# Patient Record
Sex: Female | Born: 1985 | Race: White | Hispanic: No | Marital: Married | Smoking: Never smoker
Health system: Southern US, Community
[De-identification: ages and names within clinical notes are randomized; demographics above are authoritative.]

---

## 2011-02-26 ENCOUNTER — Ambulatory Visit: Payer: Self-pay | Admitting: Internal Medicine

## 2011-08-27 ENCOUNTER — Ambulatory Visit: Payer: Self-pay

## 2011-08-28 ENCOUNTER — Ambulatory Visit: Payer: Self-pay | Admitting: Otolaryngology

## 2011-08-28 ENCOUNTER — Other Ambulatory Visit: Payer: Self-pay | Admitting: Otolaryngology

## 2011-08-28 LAB — HCG, QUANTITATIVE, PREGNANCY: Beta Hcg, Quant.: <1 m[IU]/mL — ABNORMAL LOW

## 2011-11-13 ENCOUNTER — Ambulatory Visit: Payer: Self-pay | Admitting: Obstetrics and Gynecology

## 2011-11-13 LAB — URINALYSIS, COMPLETE
Bilirubin,UR: NEGATIVE
Ketone: NEGATIVE
Leukocyte Esterase: NEGATIVE
Ph: 5 (ref 4.5–8.0)
Protein: NEGATIVE
RBC,UR: 1 /HPF (ref 0–5)
Specific Gravity: 1.019 (ref 1.003–1.030)
Squamous Epithelial: 2
WBC UR: 1 /HPF (ref 0–5)

## 2011-11-19 ENCOUNTER — Ambulatory Visit: Payer: Self-pay | Admitting: Obstetrics and Gynecology

## 2012-07-17 ENCOUNTER — Encounter: Payer: Self-pay | Admitting: Obstetrics and Gynecology

## 2013-01-29 ENCOUNTER — Inpatient Hospital Stay: Payer: Self-pay

## 2013-01-29 LAB — CBC WITH DIFFERENTIAL/PLATELET
BASOS ABS: 0 10*3/uL (ref 0.0–0.1)
Basophil %: 0.3 %
EOS ABS: 0.3 10*3/uL (ref 0.0–0.7)
EOS PCT: 2.9 %
HCT: 38.6 % (ref 35.0–47.0)
HGB: 13.4 g/dL (ref 12.0–16.0)
LYMPHS ABS: 1.4 10*3/uL (ref 1.0–3.6)
Lymphocyte %: 14.8 %
MCH: 32.9 pg (ref 26.0–34.0)
MCHC: 34.8 g/dL (ref 32.0–36.0)
MCV: 94 fL (ref 80–100)
Monocyte #: 0.9 x10 3/mm (ref 0.2–0.9)
Monocyte %: 8.8 %
Neutrophil #: 7.1 10*3/uL — ABNORMAL HIGH (ref 1.4–6.5)
Neutrophil %: 73.2 %
Platelet: 169 10*3/uL (ref 150–440)
RBC: 4.09 10*6/uL (ref 3.80–5.20)
RDW: 13 % (ref 11.5–14.5)
WBC: 9.8 10*3/uL (ref 3.6–11.0)

## 2013-01-31 LAB — HEMOGLOBIN: HGB: 11.6 g/dL — AB (ref 12.0–16.0)

## 2013-02-07 ENCOUNTER — Ambulatory Visit: Payer: Self-pay | Admitting: Pediatrics

## 2014-02-16 IMAGING — CT CT MAXILLOFACIAL WITHOUT CONTRAST
1 series · 16 of 30 positions shown, 20 images · non-contrast
Comparison: none

REASON FOR EXAM: CR [id number] chronic sinusitis
COMMENTS:

PROCEDURE:     KCT - KCT MAXILLOFACIAL AREA WO  - August 28, 2011 [DATE]
RESULT:     History: Sinusitis.

[Series 2: osteo (id) · axial · 0.29mm/px · z∈[-120,+28]mm · 16 of 107 slices shown, 20 images]
[im 4/107  brain]
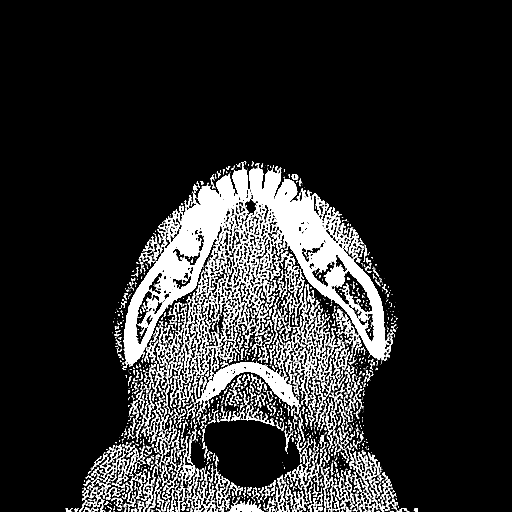
[im 4/107  bone]
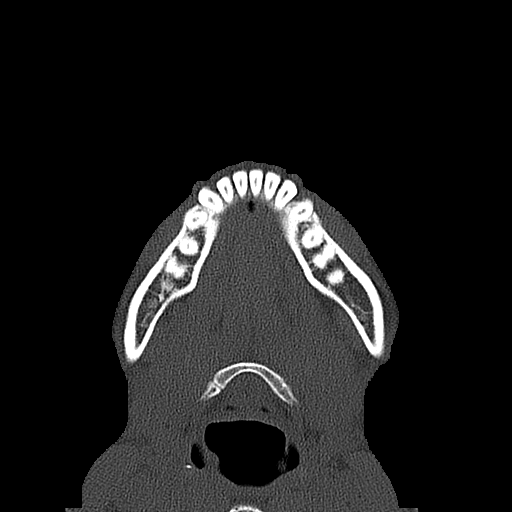
[im 11/107  bone]
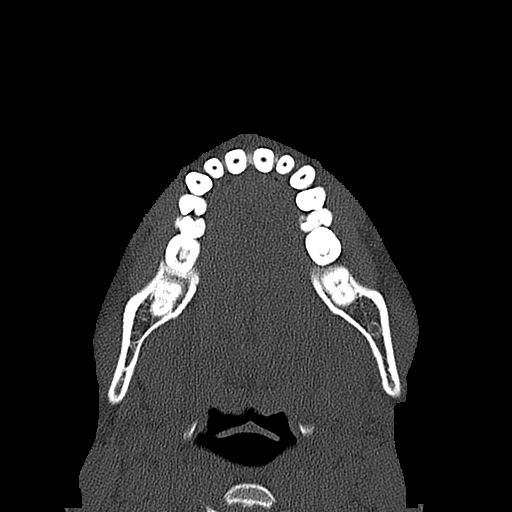
[im 19/107  bone]
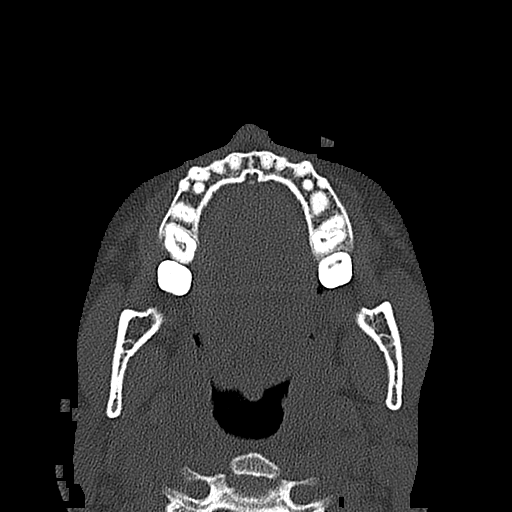
[im 26/107  bone]
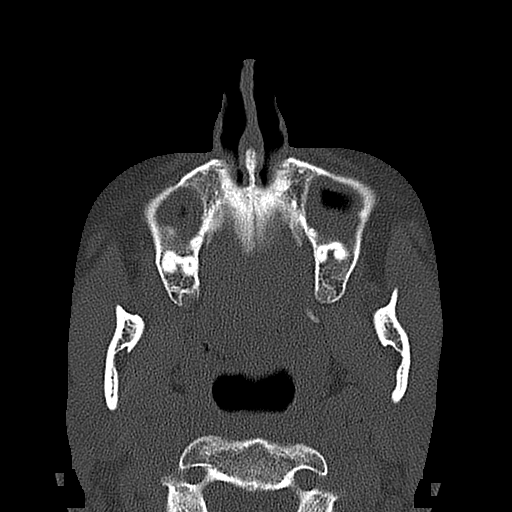
[im 30/107  brain]
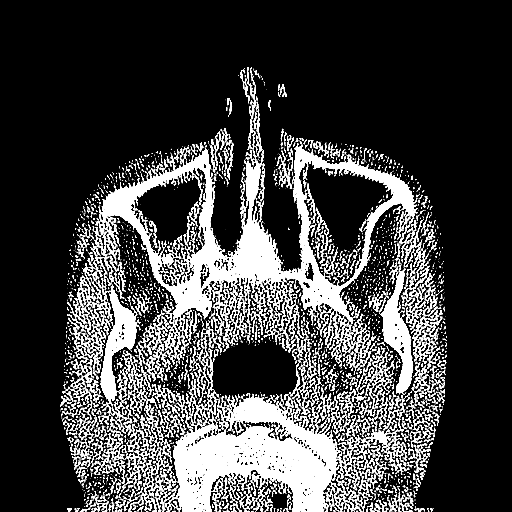
[im 30/107  bone]
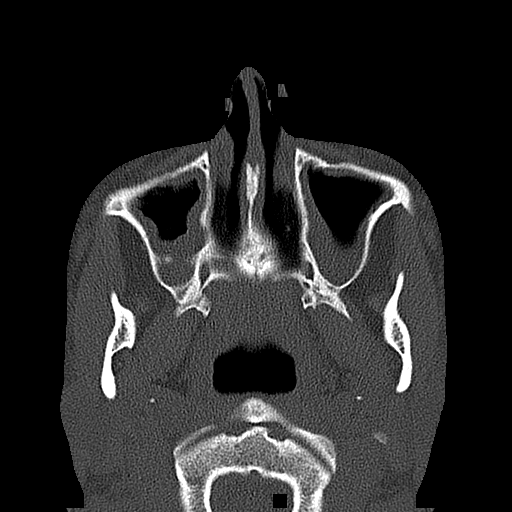
[im 37/107  bone]
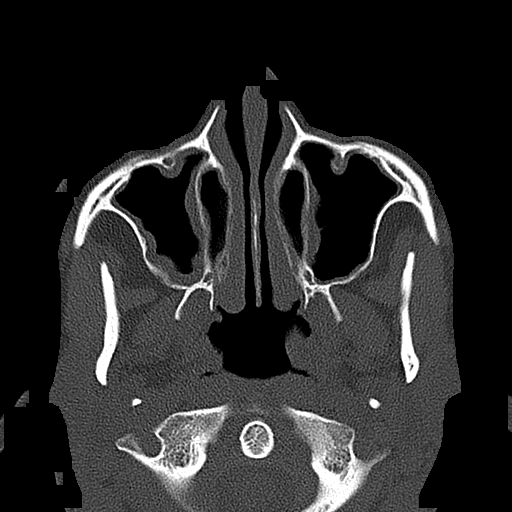
[im 44/107  bone]
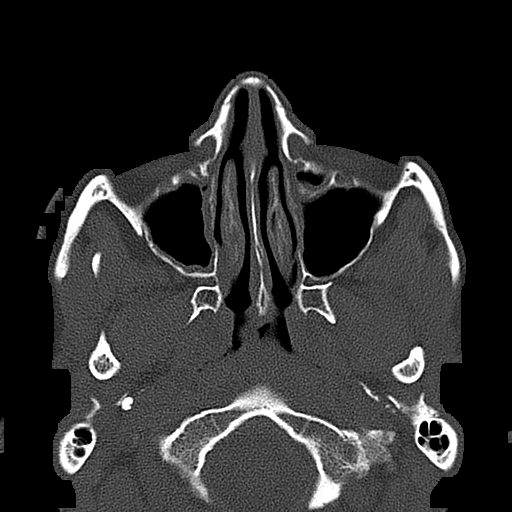
[im 52/107  bone]
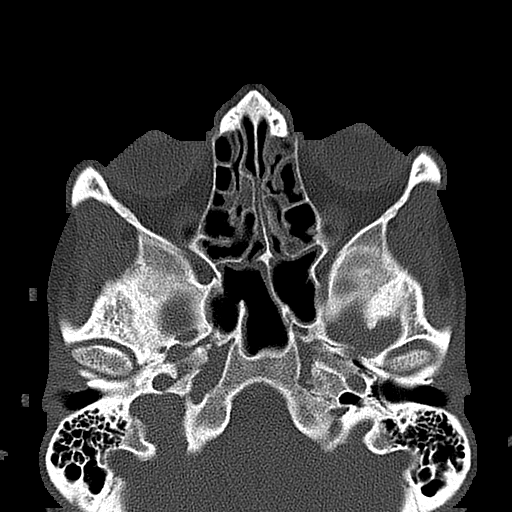
[im 55/107  brain]
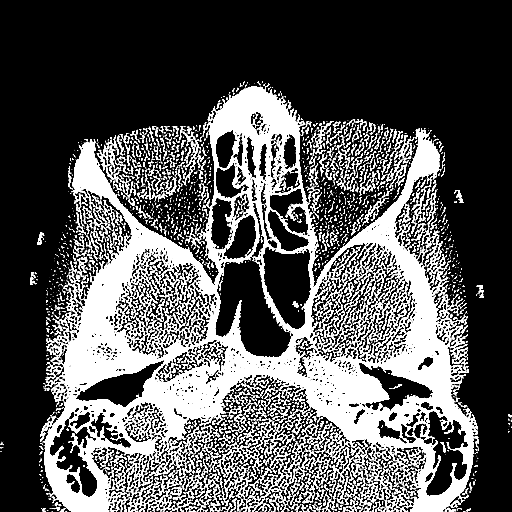
[im 55/107  bone]
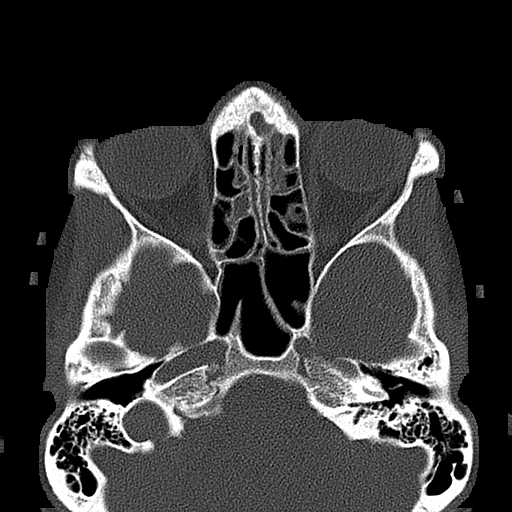
[im 63/107  bone]
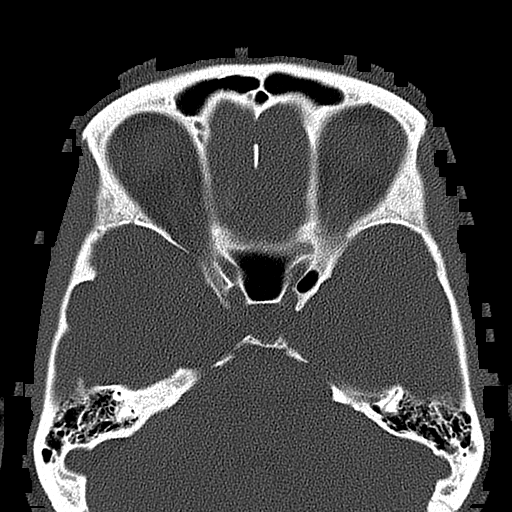
[im 70/107  bone]
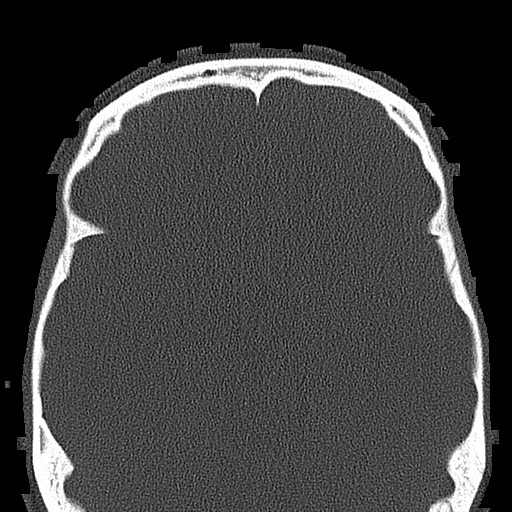
[im 77/107  bone]
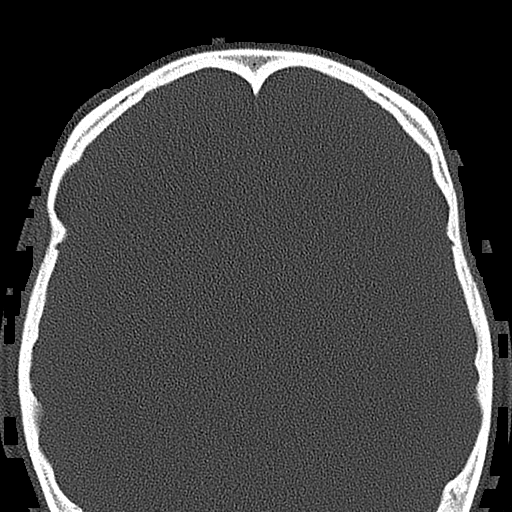
[im 81/107  brain]
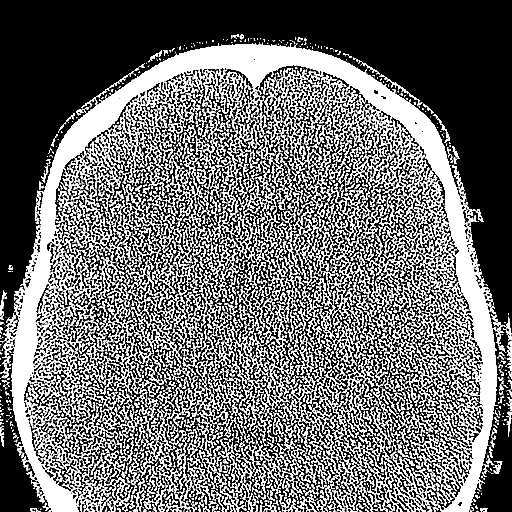
[im 81/107  bone]
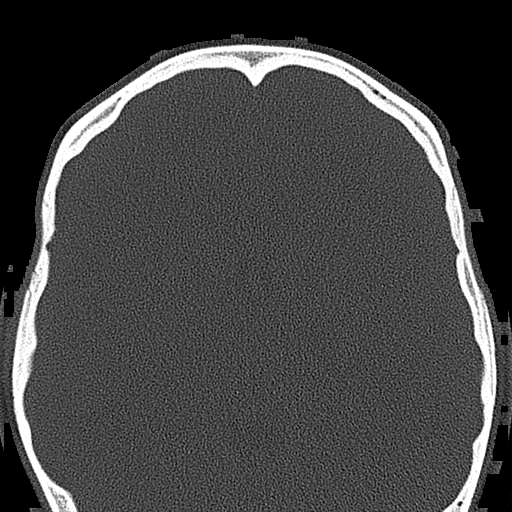
[im 88/107  bone]
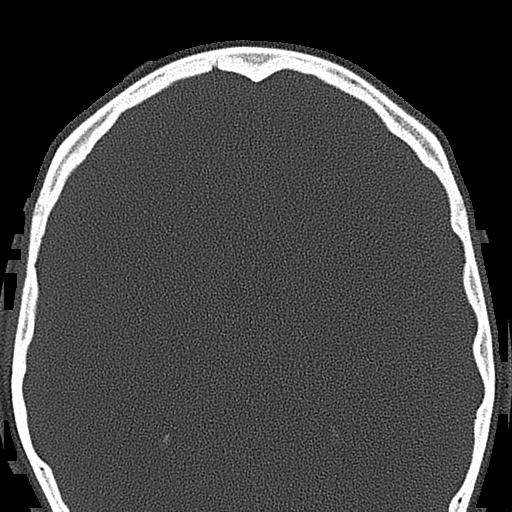
[im 96/107  bone]
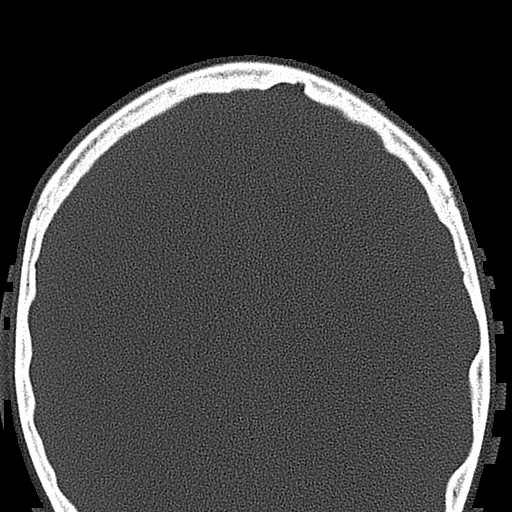
[im 103/107  bone]
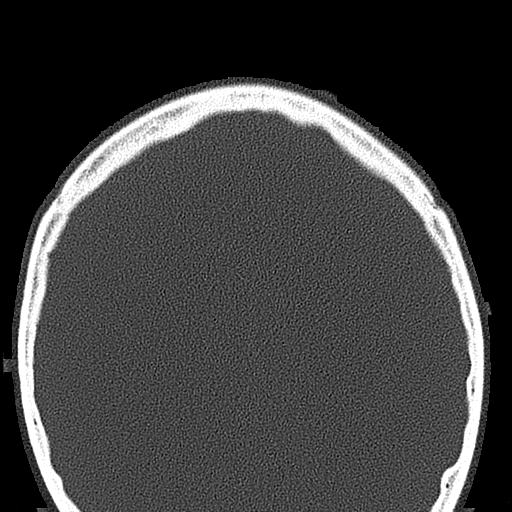

[16 of 30 positions shown; findings below may reference images not displayed]

FINDINGS: Standard CT of the paranasal sinuses obtained. Mild mucosal
thickening of the frontal, ethmoid sinuses , and maxillary sinuses
bilaterally. Similar findings noted in the sphenoid sinus. Mucosal
thickening of the maxillary ostia. Mastoids are clear. Nasopharynx clear
.Orbits are unremarkable. No acute bony abnormality identified.
IMPRESSION: Mild changes of chronic pansinusitis.

## 2014-03-04 ENCOUNTER — Ambulatory Visit: Payer: Self-pay | Admitting: Psychiatry

## 2014-03-04 LAB — CBC WITH DIFFERENTIAL/PLATELET
Basophil #: 0 10*3/uL (ref 0.0–0.1)
Basophil %: 0.6 %
Eosinophil #: 0.1 10*3/uL (ref 0.0–0.7)
Eosinophil %: 1.6 %
HCT: 40.8 % (ref 35.0–47.0)
HGB: 12.9 g/dL (ref 12.0–16.0)
Lymphocyte #: 1.2 10*3/uL (ref 1.0–3.6)
Lymphocyte %: 26.8 %
MCH: 28.7 pg (ref 26.0–34.0)
MCHC: 31.7 g/dL — ABNORMAL LOW (ref 32.0–36.0)
MCV: 91 fL (ref 80–100)
Monocyte #: 0.3 x10 3/mm (ref 0.2–0.9)
Monocyte %: 6 %
NEUTROS PCT: 65 %
Neutrophil #: 2.9 10*3/uL (ref 1.4–6.5)
PLATELETS: 182 10*3/uL (ref 150–440)
RBC: 4.5 10*6/uL (ref 3.80–5.20)
RDW: 13.7 % (ref 11.5–14.5)
WBC: 4.4 10*3/uL (ref 3.6–11.0)

## 2014-03-04 LAB — COMPREHENSIVE METABOLIC PANEL
AST: 23 U/L (ref 15–37)
Albumin: 3.6 g/dL (ref 3.4–5.0)
Alkaline Phosphatase: 63 U/L (ref 46–116)
Anion Gap: 5 — ABNORMAL LOW (ref 7–16)
BUN: 10 mg/dL (ref 7–18)
Bilirubin,Total: 0.5 mg/dL (ref 0.2–1.0)
CHLORIDE: 106 mmol/L (ref 98–107)
CREATININE: 0.71 mg/dL (ref 0.60–1.30)
Calcium, Total: 8.4 mg/dL — ABNORMAL LOW (ref 8.5–10.1)
Co2: 31 mmol/L (ref 21–32)
EGFR (African American): >60
EGFR (Non-African Amer.): >60
Glucose: 65 mg/dL (ref 65–99)
Osmolality: 280 (ref 275–301)
POTASSIUM: 3.7 mmol/L (ref 3.5–5.1)
SGPT (ALT): 18 U/L (ref 14–63)
SODIUM: 142 mmol/L (ref 136–145)
TOTAL PROTEIN: 7 g/dL (ref 6.4–8.2)

## 2014-03-04 LAB — FOLATE: Folic Acid: 15.2 ng/mL (ref 3.1–17.5)

## 2014-03-04 LAB — TSH: Thyroid Stimulating Horm: 0.815 u[IU]/mL

## 2014-05-18 NOTE — Op Note (Signed)
PATIENT NAME:  Kristen Arias, Kristen Arias MR#:  [id number] DATE OF BIRTH:  Jan 12, 1986  DATE OF PROCEDURE:  11/19/2011  PREOPERATIVE DIAGNOSIS: Pelvic pain.   POSTOPERATIVE DIAGNOSES: 1. Pelvic pain. 2. Paratubal cyst. 3. Minimal adhesive disease.   SURGEON: Ricky L. Logan BoresEvans, MD  ASSISTANT: Jennell Cornerhomas Schermerhorn, M.D.   ANESTHESIA: General endotracheal.   PROCEDURES:  1. Diagnostic laparoscopy.  2. Excision of right paratubal cyst.  3. Release of colonic adhesions from left pelvic brim and thickened uterine right uterosacral.   SPECIMENS: Right paratubal cyst.   DRAINS: In and out catheter with red rubber at the beginning of the case, clear urine.   COMPLICATIONS: None.   PROCEDURE IN DETAIL: Patient was consented. Preop antibiotics given. Taken to the Operating Room where she was placed in supine position, prepped and draped in usual sterile fashion, placed in dorsal lithotomy position. Cervix was visualized, grasped with a single-tooth tenaculum and then Hulka was placed. Bladder was drained.   Next we turned out attention to the abdomen.   Patient had somewhat of a rounded opened umbilicus which required a transverse 1 cm incision through which a 10 mm port was placed. Pneumoperitoneum was established and under direct visualization both 5 mm ports in bilateral lower quadrants with findings as noted above.   With Harmonic scalpel excised the right paratubal cyst. This was sent for permanent specimen. Alternating right and left sides were able to reduce the adherent fat pad of the colon to the left pelvic brim. There were some vascular changes in the posterior cul-de-sac which were cauterized with the Harmonic scalpel. I am uncomfortable labeling as endometriosis. Right uterosacral is somewhat thickened and this was divided with Harmonic scalpel with immediate release of tension. Left pelvis otherwise unremarkable.   Pressure lowered to 6 mmHg. Areas were seen to be hemostatic. Procedure was  felt to achieve maximum efficacy. Ports removed. Pneumoperitoneum was allowed to resolve. Incisions closed with deep of 0, subcutaneous with 3-0 Vicryl. Steri-Strips and Band-Aids were placed.   Patient tolerated procedure well. 24 mL of 0.5% Sensorcaine was used in 2 10cc, one 4 mL aliquots prior to port placements. I anticipate a routine postoperative course.   ____________________________ Reatha Harpsicky L. Logan BoresEvans, MD rle:cms D: 11/19/2011 09:51:51 ET T: 11/19/2011 10:26:59 ET JOB#: [id number]  cc: Ricky L. Logan BoresEvans, MD, <Dictator> Augustina MoodICK L Durwood Dittus MD ELECTRONICALLY SIGNED 11/20/2011 8:38

## 2014-05-22 NOTE — Discharge Summary (Signed)
Dates of Admission and Diagnosis:  Date of Admission 29-Jan-2013   Date of Discharge 01-Feb-2012   Admitting Diagnosis post dates for induction   Final Diagnosis s/p SVD    Chief Complaint/History of Present Illness ibid no complications   Hospital Course:  Hospital Course did well   Condition on Discharge Good   DISCHARGE INSTRUCTIONS HOME MEDS:  Medication Reconciliation: Patient's Home Medications at Discharge:     Physician's Instructions:  Diet Regular   Activity Limitations no lift/sex/strain   Return to Work after follow up visit with MD   Time frame for Follow Up Appointment 4-6 weeks   Electronic Signatures: Margaretha GlassingEvans, Ricky L (MD)  (Signed 04-Jan-15 12:51)  Authored: ADMISSION DATE AND DIAGNOSIS, CHIEF COMPLAINT/HPI, HOSPITAL COURSE, DISCHARGE INSTRUCTIONS HOME MEDS, PATIENT INSTRUCTIONS   Last Updated: 04-Jan-15 12:51 by Margaretha GlassingEvans, Ricky L (MD)

## 2014-06-08 NOTE — H&P (Signed)
L&D Evaluation:  History:  HPI 29 y/o P0 WF 41 weeks   Presents with for post-dates induction   Patient's Medical History No Chronic Illness   Patient's Surgical History none   Medications Pre Natal Vitamins   Allergies NKDA   Social History none   Family History Non-Contributory   ROS:  ROS All systems were reviewed.  HEENT, CNS, GI, GU, Respiratory, CV, Renal and Musculoskeletal systems were found to be normal.   Exam:  General no apparent distress   Abdomen gravid, non-tender   Estimated Fetal Weight Average for gestational age   Reflexes 1+   Clonus negative   Pelvic 3.5/80/hi after cervidil   Mebranes Intact   FHT normal rate with no decels   Lymph no lymphadenopathy   Impression:  Impression post-dates induction, s/p cervidil; hi station, suspicious for CPD   Plan:  Plan monitor contractions and for cervical change   Comments epidural now Pit ;prn If no descent or if fetal stress, will proceed with C/S   Electronic Signatures: Margaretha GlassingEvans, Ricky L (MD)  (Signed 02-Jan-15 08:37)  Authored: L&D Evaluation   Last Updated: 02-Jan-15 08:37 by Margaretha GlassingEvans, Ricky L (MD)

## 2015-01-06 IMAGING — US US OB NUCHAL TRANSLUCENCY 1ST GEST - [facility] NRPT
1 series · 14 of 28 positions shown · non-contrast
Comparison: none

[Series 1: us ob nuchal translucency 1st gest - [facility] nrpt · 0.23mm/px · 14 of 56 slices shown]
[im 3/56]
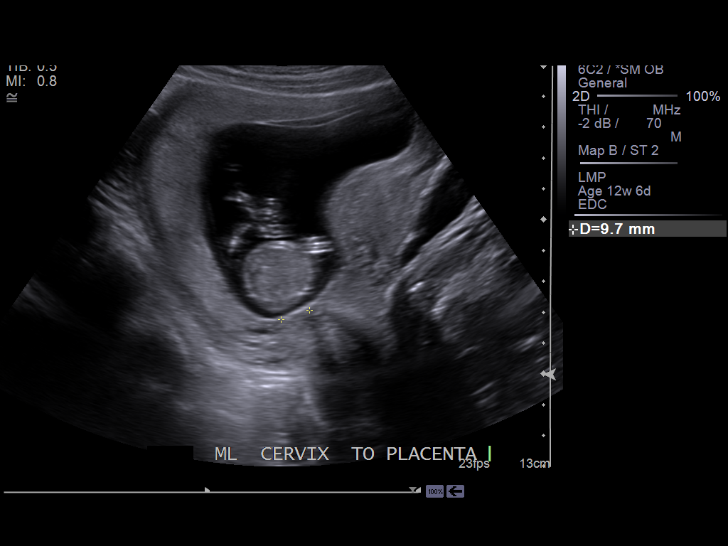
[im 7/56]
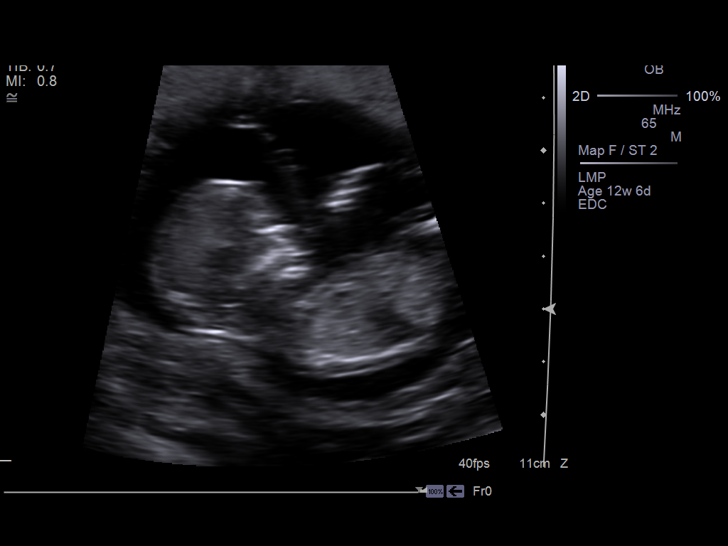
[im 11/56]
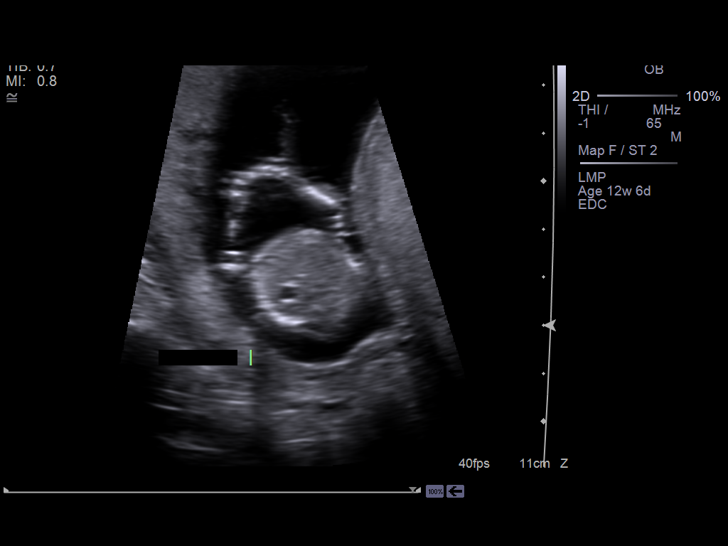
[im 15/56]
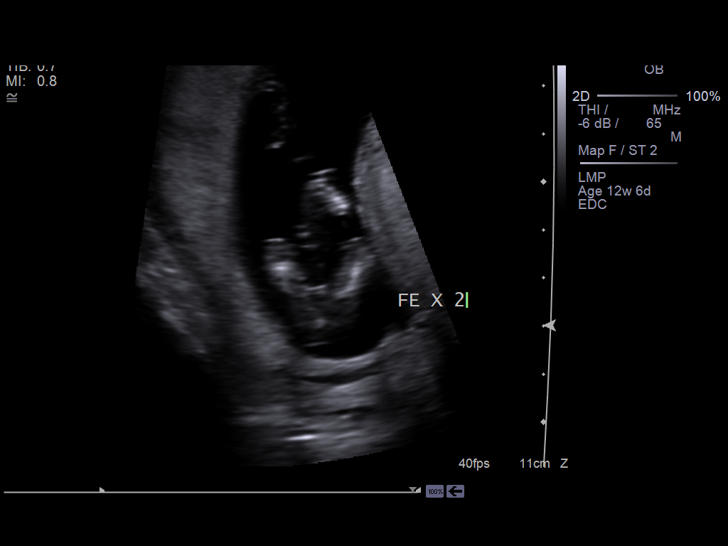
[im 19/56]
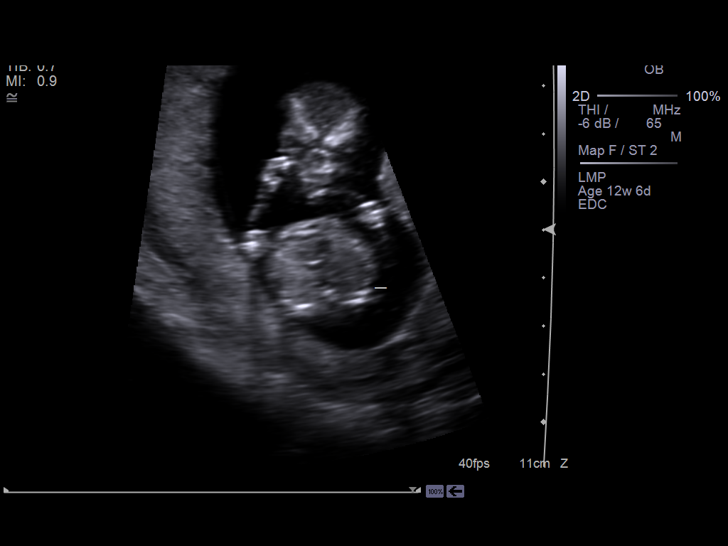
[im 23/56]
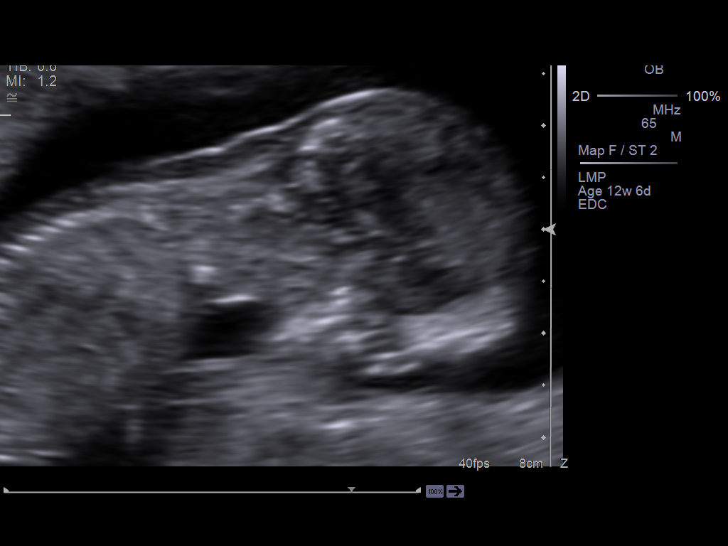
[im 27/56]
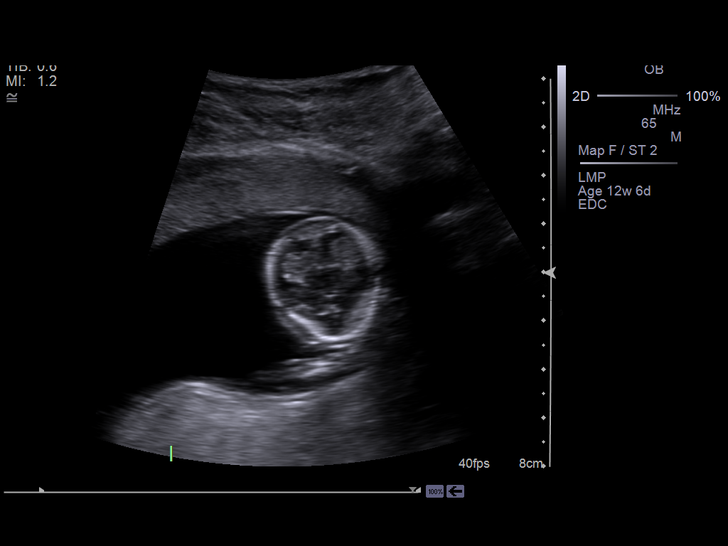
[im 31/56]
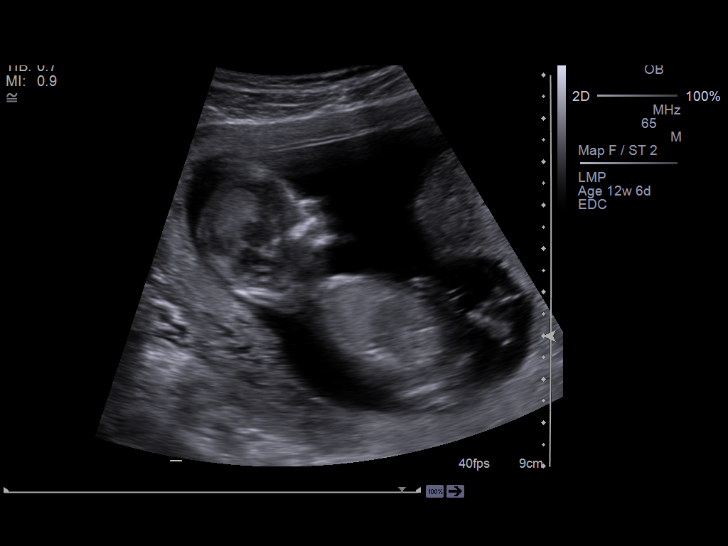
[im 35/56]
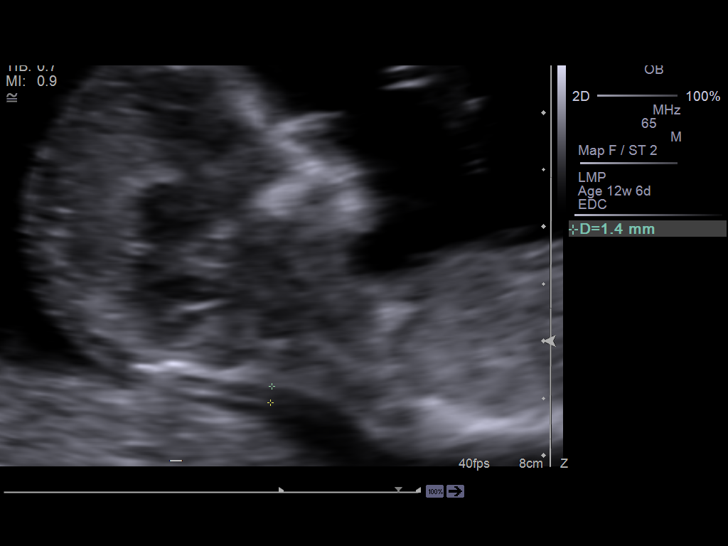
[im 39/56]
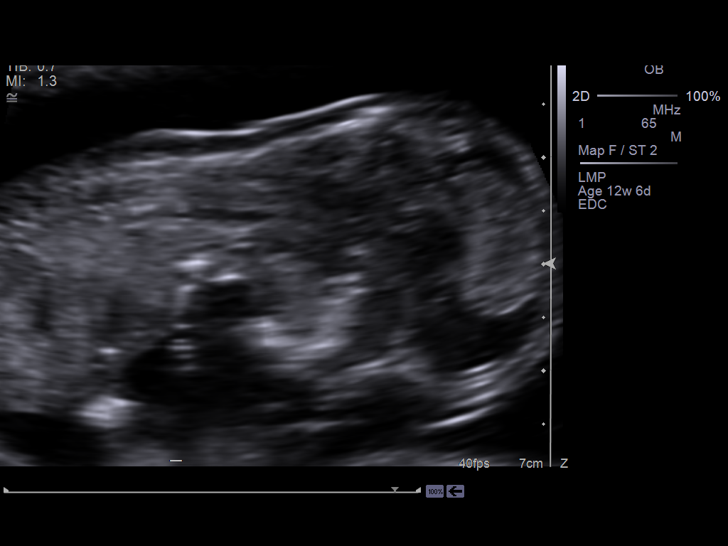
[im 43/56]
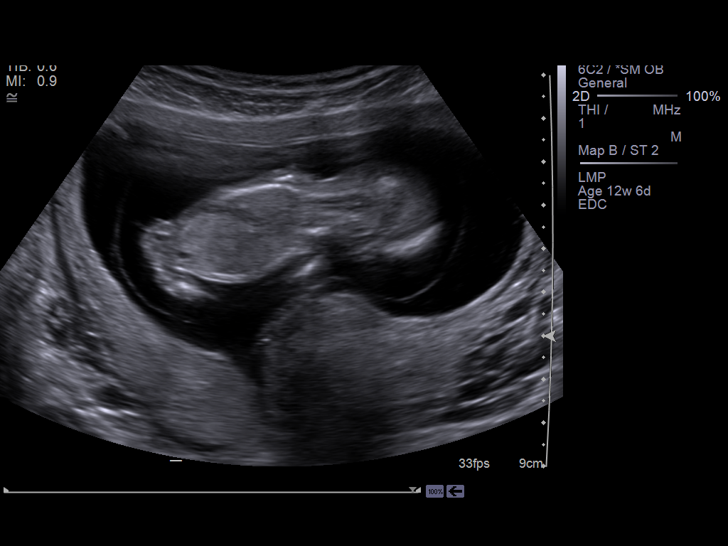
[im 47/56]
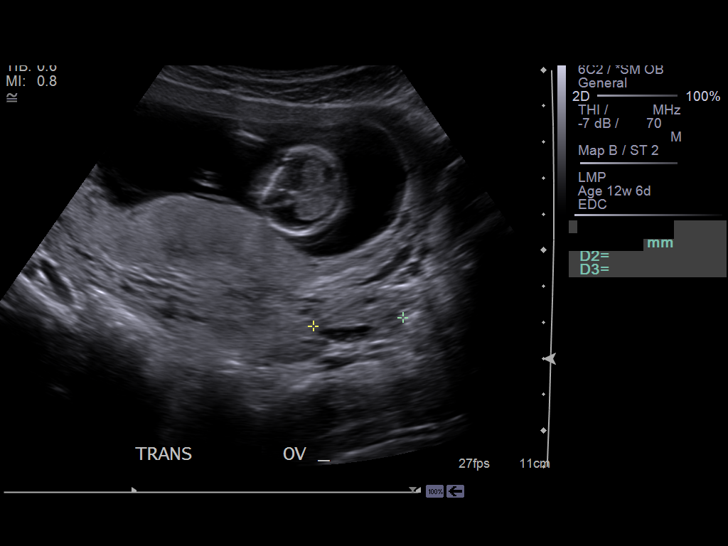
[im 51/56]
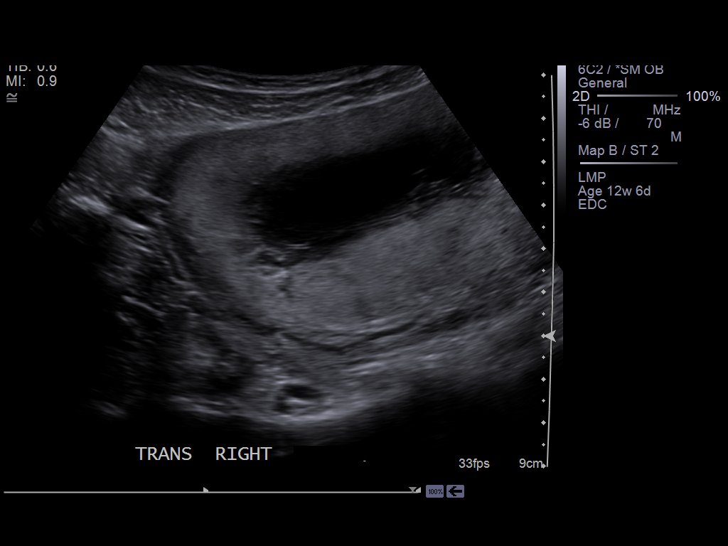
[im 56/56]
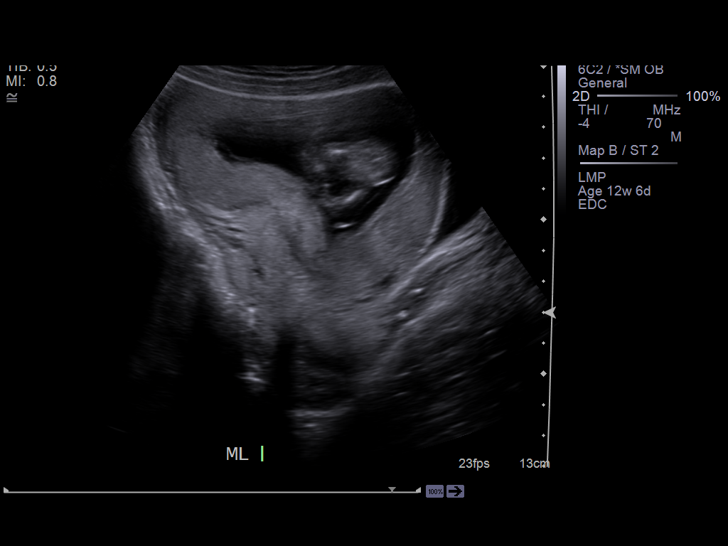

[14 of 28 positions shown; findings below may reference images not displayed]

IMAGES IMPORTED FROM THE [employer] WORKFLOW SYSTEM
NO DICTATION FOR STUDY

## 2015-02-23 DIAGNOSIS — M7981 Nontraumatic hematoma of soft tissue: Secondary | ICD-10-CM | POA: Diagnosis not present

## 2015-03-17 DIAGNOSIS — M7981 Nontraumatic hematoma of soft tissue: Secondary | ICD-10-CM | POA: Diagnosis not present

## 2015-03-17 DIAGNOSIS — I8312 Varicose veins of left lower extremity with inflammation: Secondary | ICD-10-CM | POA: Diagnosis not present

## 2015-03-17 DIAGNOSIS — I83812 Varicose veins of left lower extremities with pain: Secondary | ICD-10-CM | POA: Diagnosis not present

## 2015-03-17 DIAGNOSIS — I87322 Chronic venous hypertension (idiopathic) with inflammation of left lower extremity: Secondary | ICD-10-CM | POA: Diagnosis not present

## 2015-03-31 DIAGNOSIS — I8312 Varicose veins of left lower extremity with inflammation: Secondary | ICD-10-CM | POA: Diagnosis not present

## 2015-03-31 DIAGNOSIS — I87322 Chronic venous hypertension (idiopathic) with inflammation of left lower extremity: Secondary | ICD-10-CM | POA: Diagnosis not present

## 2015-03-31 DIAGNOSIS — I83812 Varicose veins of left lower extremities with pain: Secondary | ICD-10-CM | POA: Diagnosis not present

## 2015-04-13 DIAGNOSIS — B9689 Other specified bacterial agents as the cause of diseases classified elsewhere: Secondary | ICD-10-CM | POA: Diagnosis not present

## 2015-04-13 DIAGNOSIS — N760 Acute vaginitis: Secondary | ICD-10-CM | POA: Diagnosis not present

## 2015-04-13 DIAGNOSIS — F410 Panic disorder [episodic paroxysmal anxiety] without agoraphobia: Secondary | ICD-10-CM | POA: Diagnosis not present

## 2015-04-22 DIAGNOSIS — J301 Allergic rhinitis due to pollen: Secondary | ICD-10-CM | POA: Diagnosis not present

## 2015-04-27 DIAGNOSIS — I8312 Varicose veins of left lower extremity with inflammation: Secondary | ICD-10-CM | POA: Diagnosis not present

## 2015-04-27 DIAGNOSIS — I83812 Varicose veins of left lower extremities with pain: Secondary | ICD-10-CM | POA: Diagnosis not present

## 2015-04-27 DIAGNOSIS — I87322 Chronic venous hypertension (idiopathic) with inflammation of left lower extremity: Secondary | ICD-10-CM | POA: Diagnosis not present

## 2015-05-02 DIAGNOSIS — J301 Allergic rhinitis due to pollen: Secondary | ICD-10-CM | POA: Diagnosis not present

## 2015-05-11 DIAGNOSIS — Z01419 Encounter for gynecological examination (general) (routine) without abnormal findings: Secondary | ICD-10-CM | POA: Diagnosis not present

## 2015-06-30 DIAGNOSIS — M546 Pain in thoracic spine: Secondary | ICD-10-CM | POA: Diagnosis not present

## 2015-06-30 DIAGNOSIS — M545 Low back pain: Secondary | ICD-10-CM | POA: Diagnosis not present

## 2015-06-30 DIAGNOSIS — M542 Cervicalgia: Secondary | ICD-10-CM | POA: Diagnosis not present

## 2015-06-30 DIAGNOSIS — M7631 Iliotibial band syndrome, right leg: Secondary | ICD-10-CM | POA: Diagnosis not present

## 2015-07-04 DIAGNOSIS — M542 Cervicalgia: Secondary | ICD-10-CM | POA: Diagnosis not present

## 2015-07-04 DIAGNOSIS — M545 Low back pain: Secondary | ICD-10-CM | POA: Diagnosis not present

## 2015-07-04 DIAGNOSIS — M546 Pain in thoracic spine: Secondary | ICD-10-CM | POA: Diagnosis not present

## 2015-07-04 DIAGNOSIS — M7631 Iliotibial band syndrome, right leg: Secondary | ICD-10-CM | POA: Diagnosis not present

## 2015-07-06 DIAGNOSIS — M7631 Iliotibial band syndrome, right leg: Secondary | ICD-10-CM | POA: Diagnosis not present

## 2015-07-06 DIAGNOSIS — M546 Pain in thoracic spine: Secondary | ICD-10-CM | POA: Diagnosis not present

## 2015-07-06 DIAGNOSIS — M545 Low back pain: Secondary | ICD-10-CM | POA: Diagnosis not present

## 2015-07-06 DIAGNOSIS — M542 Cervicalgia: Secondary | ICD-10-CM | POA: Diagnosis not present

## 2015-07-11 DIAGNOSIS — M542 Cervicalgia: Secondary | ICD-10-CM | POA: Diagnosis not present

## 2015-07-11 DIAGNOSIS — M7631 Iliotibial band syndrome, right leg: Secondary | ICD-10-CM | POA: Diagnosis not present

## 2015-07-11 DIAGNOSIS — M546 Pain in thoracic spine: Secondary | ICD-10-CM | POA: Diagnosis not present

## 2015-07-11 DIAGNOSIS — M545 Low back pain: Secondary | ICD-10-CM | POA: Diagnosis not present

## 2015-07-14 DIAGNOSIS — M542 Cervicalgia: Secondary | ICD-10-CM | POA: Diagnosis not present

## 2015-07-14 DIAGNOSIS — M545 Low back pain: Secondary | ICD-10-CM | POA: Diagnosis not present

## 2015-07-14 DIAGNOSIS — M546 Pain in thoracic spine: Secondary | ICD-10-CM | POA: Diagnosis not present

## 2015-07-14 DIAGNOSIS — M7631 Iliotibial band syndrome, right leg: Secondary | ICD-10-CM | POA: Diagnosis not present

## 2015-07-25 DIAGNOSIS — M7631 Iliotibial band syndrome, right leg: Secondary | ICD-10-CM | POA: Diagnosis not present

## 2015-07-25 DIAGNOSIS — M545 Low back pain: Secondary | ICD-10-CM | POA: Diagnosis not present

## 2015-07-25 DIAGNOSIS — M546 Pain in thoracic spine: Secondary | ICD-10-CM | POA: Diagnosis not present

## 2015-07-25 DIAGNOSIS — M542 Cervicalgia: Secondary | ICD-10-CM | POA: Diagnosis not present

## 2015-07-28 DIAGNOSIS — M542 Cervicalgia: Secondary | ICD-10-CM | POA: Diagnosis not present

## 2015-07-28 DIAGNOSIS — M545 Low back pain: Secondary | ICD-10-CM | POA: Diagnosis not present

## 2015-07-28 DIAGNOSIS — M7631 Iliotibial band syndrome, right leg: Secondary | ICD-10-CM | POA: Diagnosis not present

## 2015-07-28 DIAGNOSIS — M546 Pain in thoracic spine: Secondary | ICD-10-CM | POA: Diagnosis not present

## 2015-08-01 DIAGNOSIS — M546 Pain in thoracic spine: Secondary | ICD-10-CM | POA: Diagnosis not present

## 2015-08-01 DIAGNOSIS — M7631 Iliotibial band syndrome, right leg: Secondary | ICD-10-CM | POA: Diagnosis not present

## 2015-08-01 DIAGNOSIS — M545 Low back pain: Secondary | ICD-10-CM | POA: Diagnosis not present

## 2015-08-01 DIAGNOSIS — M542 Cervicalgia: Secondary | ICD-10-CM | POA: Diagnosis not present

## 2015-08-19 DIAGNOSIS — J301 Allergic rhinitis due to pollen: Secondary | ICD-10-CM | POA: Diagnosis not present

## 2015-08-22 DIAGNOSIS — J301 Allergic rhinitis due to pollen: Secondary | ICD-10-CM | POA: Diagnosis not present

## 2015-10-06 DIAGNOSIS — J301 Allergic rhinitis due to pollen: Secondary | ICD-10-CM | POA: Diagnosis not present

## 2015-11-18 DIAGNOSIS — H52223 Regular astigmatism, bilateral: Secondary | ICD-10-CM | POA: Diagnosis not present

## 2015-12-05 DIAGNOSIS — Z3200 Encounter for pregnancy test, result unknown: Secondary | ICD-10-CM | POA: Diagnosis not present

## 2016-03-12 ENCOUNTER — Encounter: Payer: Self-pay | Admitting: Physician Assistant

## 2016-03-12 ENCOUNTER — Ambulatory Visit: Payer: Self-pay | Admitting: Physician Assistant

## 2016-03-12 VITALS — BP 90/60 | HR 60 | Temp 98.3°F

## 2016-03-12 DIAGNOSIS — H9201 Otalgia, right ear: Secondary | ICD-10-CM

## 2016-03-12 NOTE — Progress Notes (Signed)
S: c/o r ear pain, sharp shooting type pain, sx started last night, no fever/chills/cough/congestion, no known injury, no rash, no drainage from the ear  O: vitals wnl, nad, tms clear b/l, throat wnl, neck supple no lymph, lungs c t a, cv rrr, tmj nontender  A: acute ear pain  P: otc sudafed, motrin, return if worsening

## 2016-06-11 DIAGNOSIS — R509 Fever, unspecified: Secondary | ICD-10-CM | POA: Diagnosis not present

## 2016-06-11 DIAGNOSIS — R51 Headache: Secondary | ICD-10-CM | POA: Diagnosis not present

## 2016-06-11 DIAGNOSIS — M549 Dorsalgia, unspecified: Secondary | ICD-10-CM | POA: Diagnosis not present

## 2016-06-11 DIAGNOSIS — J029 Acute pharyngitis, unspecified: Secondary | ICD-10-CM | POA: Diagnosis not present

## 2016-06-11 DIAGNOSIS — R110 Nausea: Secondary | ICD-10-CM | POA: Diagnosis not present

## 2016-06-11 DIAGNOSIS — M542 Cervicalgia: Secondary | ICD-10-CM | POA: Diagnosis not present

## 2016-06-11 DIAGNOSIS — M2560 Stiffness of unspecified joint, not elsewhere classified: Secondary | ICD-10-CM | POA: Diagnosis not present

## 2016-06-12 DIAGNOSIS — M2560 Stiffness of unspecified joint, not elsewhere classified: Secondary | ICD-10-CM | POA: Diagnosis not present

## 2016-06-12 DIAGNOSIS — R509 Fever, unspecified: Secondary | ICD-10-CM | POA: Diagnosis not present

## 2016-06-12 DIAGNOSIS — M549 Dorsalgia, unspecified: Secondary | ICD-10-CM | POA: Diagnosis not present

## 2016-06-12 DIAGNOSIS — R110 Nausea: Secondary | ICD-10-CM | POA: Diagnosis not present

## 2016-06-12 DIAGNOSIS — M542 Cervicalgia: Secondary | ICD-10-CM | POA: Diagnosis not present

## 2016-06-12 DIAGNOSIS — R51 Headache: Secondary | ICD-10-CM | POA: Diagnosis not present

## 2016-06-12 DIAGNOSIS — J029 Acute pharyngitis, unspecified: Secondary | ICD-10-CM | POA: Diagnosis not present

## 2016-06-14 DIAGNOSIS — M542 Cervicalgia: Secondary | ICD-10-CM | POA: Diagnosis not present

## 2016-06-14 DIAGNOSIS — R509 Fever, unspecified: Secondary | ICD-10-CM | POA: Diagnosis not present

## 2016-06-14 DIAGNOSIS — J029 Acute pharyngitis, unspecified: Secondary | ICD-10-CM | POA: Diagnosis not present

## 2016-06-14 DIAGNOSIS — G971 Other reaction to spinal and lumbar puncture: Secondary | ICD-10-CM | POA: Diagnosis not present

## 2016-06-15 DIAGNOSIS — R51 Headache: Secondary | ICD-10-CM | POA: Diagnosis not present

## 2016-06-15 DIAGNOSIS — G971 Other reaction to spinal and lumbar puncture: Secondary | ICD-10-CM | POA: Diagnosis not present

## 2016-06-19 DIAGNOSIS — Z01419 Encounter for gynecological examination (general) (routine) without abnormal findings: Secondary | ICD-10-CM | POA: Diagnosis not present

## 2016-06-19 DIAGNOSIS — F4321 Adjustment disorder with depressed mood: Secondary | ICD-10-CM | POA: Diagnosis not present

## 2016-06-19 DIAGNOSIS — F411 Generalized anxiety disorder: Secondary | ICD-10-CM | POA: Diagnosis not present

## 2016-06-19 DIAGNOSIS — Z1389 Encounter for screening for other disorder: Secondary | ICD-10-CM | POA: Diagnosis not present

## 2016-09-26 DIAGNOSIS — F4323 Adjustment disorder with mixed anxiety and depressed mood: Secondary | ICD-10-CM | POA: Diagnosis not present

## 2016-10-10 DIAGNOSIS — F4310 Post-traumatic stress disorder, unspecified: Secondary | ICD-10-CM | POA: Diagnosis not present

## 2016-10-31 DIAGNOSIS — F4310 Post-traumatic stress disorder, unspecified: Secondary | ICD-10-CM | POA: Diagnosis not present

## 2016-11-14 DIAGNOSIS — F4310 Post-traumatic stress disorder, unspecified: Secondary | ICD-10-CM | POA: Diagnosis not present

## 2016-11-28 DIAGNOSIS — F4310 Post-traumatic stress disorder, unspecified: Secondary | ICD-10-CM | POA: Diagnosis not present

## 2016-12-05 DIAGNOSIS — F438 Other reactions to severe stress: Secondary | ICD-10-CM | POA: Diagnosis not present

## 2017-01-04 DIAGNOSIS — F4310 Post-traumatic stress disorder, unspecified: Secondary | ICD-10-CM | POA: Diagnosis not present

## 2017-01-25 DIAGNOSIS — J020 Streptococcal pharyngitis: Secondary | ICD-10-CM | POA: Diagnosis not present

## 2017-01-30 DIAGNOSIS — F438 Other reactions to severe stress: Secondary | ICD-10-CM | POA: Diagnosis not present

## 2017-02-13 DIAGNOSIS — F4310 Post-traumatic stress disorder, unspecified: Secondary | ICD-10-CM | POA: Diagnosis not present

## 2017-03-04 DIAGNOSIS — H43392 Other vitreous opacities, left eye: Secondary | ICD-10-CM | POA: Diagnosis not present

## 2017-03-06 DIAGNOSIS — F438 Other reactions to severe stress: Secondary | ICD-10-CM | POA: Diagnosis not present
